# Patient Record
Sex: Male | Born: 1990 | Race: White | Hispanic: No | Marital: Married | Smoking: Never smoker
Health system: Southern US, Community
[De-identification: ages and names within clinical notes are randomized; demographics above are authoritative.]

## PROBLEM LIST (undated history)

## (undated) DIAGNOSIS — K219 Gastro-esophageal reflux disease without esophagitis: Secondary | ICD-10-CM

## (undated) HISTORY — PX: NO PAST SURGERIES: SHX2092

## (undated) HISTORY — DX: Gastro-esophageal reflux disease without esophagitis: K21.9

---

## 2021-01-24 ENCOUNTER — Other Ambulatory Visit: Payer: Self-pay

## 2021-01-24 ENCOUNTER — Encounter (HOSPITAL_BASED_OUTPATIENT_CLINIC_OR_DEPARTMENT_OTHER): Payer: Self-pay | Admitting: Obstetrics and Gynecology

## 2021-01-24 ENCOUNTER — Emergency Department (HOSPITAL_BASED_OUTPATIENT_CLINIC_OR_DEPARTMENT_OTHER): Payer: No Typology Code available for payment source | Admitting: Radiology

## 2021-01-24 ENCOUNTER — Emergency Department (HOSPITAL_BASED_OUTPATIENT_CLINIC_OR_DEPARTMENT_OTHER)
Admission: EM | Admit: 2021-01-24 | Discharge: 2021-01-25 | Disposition: A | Discharge status: Home / Self Care | Payer: No Typology Code available for payment source | Attending: Emergency Medicine | Admitting: Emergency Medicine

## 2021-01-24 DIAGNOSIS — R0789 Other chest pain: Secondary | ICD-10-CM | POA: Diagnosis not present

## 2021-01-24 DIAGNOSIS — R112 Nausea with vomiting, unspecified: Secondary | ICD-10-CM | POA: Diagnosis not present

## 2021-01-24 LAB — CBC
HCT: 46.8 % (ref 39.0–52.0)
Hemoglobin: 15.2 g/dL (ref 13.0–17.0)
MCH: 26.4 pg (ref 26.0–34.0)
MCHC: 32.5 g/dL (ref 30.0–36.0)
MCV: 81.3 fL (ref 80.0–100.0)
Platelets: 288 10*3/uL (ref 150–400)
RBC: 5.76 MIL/uL (ref 4.22–5.81)
RDW: 13 % (ref 11.5–15.5)
WBC: 12.1 10*3/uL — ABNORMAL HIGH (ref 4.0–10.5)
nRBC: 0 % (ref 0.0–0.2)

## 2021-01-24 LAB — BASIC METABOLIC PANEL
Anion gap: 11 (ref 5–15)
BUN: 16 mg/dL (ref 6–20)
CO2: 26 mmol/L (ref 22–32)
Calcium: 9.6 mg/dL (ref 8.9–10.3)
Chloride: 103 mmol/L (ref 98–111)
Creatinine, Ser: 0.86 mg/dL (ref 0.61–1.24)
GFR, Estimated: >60 mL/min (ref >60)
Glucose, Bld: 107 mg/dL — ABNORMAL HIGH (ref 70–99)
Potassium: 3.5 mmol/L (ref 3.5–5.1)
Sodium: 140 mmol/L (ref 135–145)

## 2021-01-24 LAB — TROPONIN I (HIGH SENSITIVITY): Troponin I (High Sensitivity): 3 ng/L (ref <18)

## 2021-01-24 NOTE — ED Provider Notes (Signed)
[facility] EMERGENCY DEPT Provider Note   CSN: [id number] Arrival date & time: 01/24/21  2213     History Chief Complaint  Patient presents with   Chest Pain    James Forbes is a 30 y.o. male.  Patient presents to the emergency department for evaluation of chest pain.  Patient reports that he started having chest pain around 8:30 PM.  Pain would come and go.  No radiation of the pain.  Patient reports that he acutely had an episode of vomiting and that did seem to alleviate some of the pain.  Currently he has not experiencing any chest pain.  He does not have any shortness of breath currently.  No hypertension, diabetes, hyperlipidemia, family history of heart disease.   HPI: A 30 year old patient presents for evaluation of chest pain. Initial onset of pain was approximately 3-6 hours ago. The patient's chest pain is sharp and is not worse with exertion. The patient complains of nausea. The patient's chest pain is not middle- or left-sided, is not well-localized, is not described as heaviness/pressure/tightness and does not radiate to the arms/jaw/neck. The patient denies diaphoresis. The patient has no history of stroke, has no history of peripheral artery disease, has not smoked in the past 90 days, denies any history of treated diabetes, has no relevant family history of coronary artery disease (first degree relative at less than age 31), is not hypertensive, has no history of hypercholesterolemia and does not have an elevated BMI (>=30).   History reviewed. No pertinent past medical history.  There are no problems to display for this patient.   History reviewed. No pertinent surgical history.     History reviewed. No pertinent family history.  Social History   Tobacco Use   Smoking status: Never   Smokeless tobacco: Never  Vaping Use   Vaping Use: Never used  Substance Use Topics   Alcohol use: Yes   Drug use: Yes    Frequency: 7.0 times per week     Types: Marijuana    Home Medications Prior to Admission medications   Not on File    Allergies    Patient has no allergy information on record.  Review of Systems   Review of Systems  Cardiovascular:  Positive for chest pain.  Gastrointestinal:  Positive for nausea and vomiting. Negative for abdominal pain.  All other systems reviewed and are negative.  Physical Exam Updated Vital Signs BP 107/81   Pulse 96   Temp 98.8 F (37.1 C)   Resp 13   Ht 5\' 7"  (1.702 m)   Wt 77.1 kg   SpO2 95%   BMI 26.63 kg/m   Physical Exam Vitals and nursing note reviewed.  Constitutional:      General: He is not in acute distress.    Appearance: Normal appearance. He is well-developed.  HENT:     Head: Normocephalic and atraumatic.     Right Ear: Hearing normal.     Left Ear: Hearing normal.     Nose: Nose normal.  Eyes:     Conjunctiva/sclera: Conjunctivae normal.     Pupils: Pupils are equal, round, and reactive to light.  Cardiovascular:     Rate and Rhythm: Regular rhythm.     Heart sounds: S1 normal and S2 normal. No murmur heard.   No friction rub. No gallop.  Pulmonary:     Effort: Pulmonary effort is normal. No respiratory distress.     Breath sounds: Normal breath sounds.  Chest:  Chest wall: No tenderness.  Abdominal:     General: Bowel sounds are normal.     Palpations: Abdomen is soft.     Tenderness: There is no abdominal tenderness. There is no guarding or rebound. Negative signs include Murphy's sign and McBurney's sign.     Hernia: No hernia is present.  Musculoskeletal:        General: Normal range of motion.     Cervical back: Normal range of motion and neck supple.  Skin:    General: Skin is warm and dry.     Findings: No rash.  Neurological:     Mental Status: He is alert and oriented to person, place, and time.     GCS: GCS eye subscore is 4. GCS verbal subscore is 5. GCS motor subscore is 6.     Cranial Nerves: No cranial nerve deficit.      Sensory: No sensory deficit.     Coordination: Coordination normal.  Psychiatric:        Speech: Speech normal.        Behavior: Behavior normal.        Thought Content: Thought content normal.    ED Results / Procedures / Treatments   Labs (all labs ordered are listed, but only abnormal results are displayed) Labs Reviewed  BASIC METABOLIC PANEL - Abnormal; Notable for the following components:      Result Value   Glucose, Bld 107 (*)    All other components within normal limits  CBC - Abnormal; Notable for the following components:   WBC 12.1 (*)    All other components within normal limits  D-DIMER, QUANTITATIVE  TROPONIN I (HIGH SENSITIVITY)  TROPONIN I (HIGH SENSITIVITY)    EKG EKG Interpretation  Date/Time:  Thursday January 24 2021 22:21:04 EDT Ventricular Rate:  111 PR Interval:  135 QRS Duration: 87 QT Interval:  316 QTC Calculation: 430 R Axis:   79 Text Interpretation: Sinus tachycardia Confirmed by Gilda Crease [id number]) on 01/24/2021 11:34:10 PM  Radiology DG Chest 2 View  Result Date: 01/24/2021 CLINICAL DATA:  30 year old male with chest pain. EXAM: CHEST - 2 VIEW COMPARISON:  None. FINDINGS: The heart size and mediastinal contours are within normal limits. Both lungs are clear. The visualized skeletal structures are unremarkable. IMPRESSION: No active cardiopulmonary disease. Electronically Signed   By: Elgie Collard M.D.   On: 01/24/2021 22:50    Procedures Procedures   Medications Ordered in ED Medications - No data to display  ED Course  I have reviewed the triage vital signs and the nursing notes.  Pertinent labs & imaging results that were available during my care of the patient were reviewed by me and considered in my medical decision making (see chart for details).    MDM Rules/Calculators/A&P HEAR Score: 0                        Patient presents to the emergency department for evaluation of chest pain.  Patient with history of  GERD, no other past medical history.  Symptoms are atypical, nonexertional. HEAR score is 0.  Pain is actually resolved while here in the emergency department.  EKG unremarkable.  Troponins negative x2.  Because patient was mildly tachycardic at arrival, PE was considered but felt unlikely.  D-dimer is negative, at this essentially rules out PE by Wells criteria.  Patient appears well at this time.  All other labs are unremarkable.  Chest x-ray is clear, no  evidence of lung pathology.  Patient reassured, no further work-up necessary.  Final Clinical Impression(s) / ED Diagnoses Final diagnoses:  Non-cardiac chest pain    Rx / DC Orders ED Discharge Orders     None        Yunique Dearcos, Canary Brim, MD 01/25/21 [phone]

## 2021-01-24 NOTE — ED Triage Notes (Signed)
Patient reports to the ER for chest pain and SOB. Patient reports emesis and diarrhea. Patient reports he smoked pot today.

## 2021-01-25 LAB — D-DIMER, QUANTITATIVE: D-Dimer, Quant: <0.27 ug/mL-FEU (ref 0.00–0.50)

## 2021-01-25 LAB — TROPONIN I (HIGH SENSITIVITY): Troponin I (High Sensitivity): 2 ng/L (ref <18)

## 2021-02-17 NOTE — Progress Notes (Signed)
**Note James-Identified via Obfuscation** Virtual Visit via Telephone Note  I connected with James Forbes, on 02/18/2021 at 1:56 PM by telephone due to the COVID-19 pandemic and verified that I am speaking with the correct person using two identifiers.  Due to current restrictions/limitations of in-office visits due to the COVID-19 pandemic, this scheduled clinical appointment was converted to a telehealth visit.   Consent: I discussed the limitations, risks, security and privacy concerns of performing an evaluation and management service by telephone and the availability of in person appointments. I also discussed with the patient that there may be a patient responsible charge related to this service. The patient expressed understanding and agreed to proceed.   Location of Patient: Home  Location of Provider: [facility]   Persons participating in Telemedicine visit: Merion Grimaldo Ricky Stabs, NP Margorie John, CMA   History of Present Illness: James Forbes is a 30 year-old male who presents to establish care.   Current issues and/or concerns: None   No past medical history on file. No Known Allergies  No current outpatient medications on file prior to visit.   No current facility-administered medications on file prior to visit.    Observations/Objective: Alert and oriented x 3. Not in acute distress. Physical examination not completed as this is a telemedicine visit.  Assessment and Plan: 1. Encounter to establish care: - Patient presents today to establish care.  - Return for annual physical examination, labs, and health maintenance. Arrive fasting meaning having no food for at least 8 hours prior to appointment. You may have only water or black coffee. Please take scheduled medications as normal.   Follow Up Instructions: Return for annual physical exam.    Patient was given clear instructions to go to Emergency Department or return to medical center if symptoms  don't improve, worsen, or new problems develop.The patient verbalized understanding.  I discussed the assessment and treatment plan with the patient. The patient was provided an opportunity to ask questions and all were answered. The patient agreed with the plan and demonstrated an understanding of the instructions.   The patient was advised to call back or seek an in-person evaluation if the symptoms worsen or if the condition fails to improve as anticipated.   I provided 5 minutes total of non-face-to-face time during this encounter.   Rema Fendt, NP  [facility] [place], [place] [phone] 02/18/2021, 1:56 PM

## 2021-02-18 ENCOUNTER — Other Ambulatory Visit: Payer: Self-pay

## 2021-02-18 ENCOUNTER — Encounter: Payer: Self-pay | Admitting: Family

## 2021-02-18 ENCOUNTER — Telehealth (INDEPENDENT_AMBULATORY_CARE_PROVIDER_SITE_OTHER): Payer: Self-pay | Admitting: Family

## 2021-02-18 DIAGNOSIS — Z7689 Persons encountering health services in other specified circumstances: Secondary | ICD-10-CM

## 2021-02-18 NOTE — Progress Notes (Signed)
Pt presents for telemedicine to establish care has not seen provider in 8 years

## 2021-04-08 NOTE — Progress Notes (Signed)
Patient ID: James Forbes, male    DOB: Jun 03, 1991  MRN: [id number]  CC: Annual Physical Exam   Subjective: James Forbes is a 30 y.o. male who presents for annual physical exam.   His concerns today include:  None    Current Outpatient Medications on File Prior to Visit  Medication Sig Dispense Refill   omeprazole (PRILOSEC) 20 MG capsule Take 20 mg by mouth daily.     No current facility-administered medications on file prior to visit.    No Known Allergies  Social History   Socioeconomic History   Marital status: Married    Spouse name: Not on file   Number of children: Not on file   Years of education: Not on file   Highest education level: Not on file  Occupational History   Not on file  Tobacco Use   Smoking status: Never   Smokeless tobacco: Never  Vaping Use   Vaping Use: Never used  Substance and Sexual Activity   Alcohol use: Not Currently   Drug use: Yes    Frequency: 7.0 times per week    Types: Marijuana   Sexual activity: Yes  Other Topics Concern   Not on file  Social History Narrative   Not on file   Social Determinants of Health   Financial Resource Strain: Not on file  Food Insecurity: Not on file  Transportation Needs: Not on file  Physical Activity: Not on file  Stress: Not on file  Social Connections: Not on file  Intimate Partner Violence: Not on file    No family history on file.  No past surgical history on file.  ROS: Review of Systems Negative except as stated above  PHYSICAL EXAM: BP 108/78 (BP Location: Left Arm, Patient Position: Sitting, Cuff Size: Normal)   Pulse 97   Temp 97.9 F (36.6 C)   Resp 18   Ht 5' 7.68" (1.719 m)   Wt 163 lb 9.6 oz (74.2 kg)   SpO2 98%   BMI 25.11 kg/m   Physical Exam HENT:     Head: Normocephalic and atraumatic.     Right Ear: Tympanic membrane, ear canal and external ear normal.     Left Ear: Tympanic membrane, ear canal and external ear normal.  Eyes:     Extraocular  Movements: Extraocular movements intact.     Conjunctiva/sclera: Conjunctivae normal.     Pupils: Pupils are equal, round, and reactive to light.  Cardiovascular:     Rate and Rhythm: Normal rate and regular rhythm.     Pulses: Normal pulses.     Heart sounds: Normal heart sounds.  Pulmonary:     Effort: Pulmonary effort is normal.     Breath sounds: Normal breath sounds.  Abdominal:     General: Bowel sounds are normal.     Palpations: Abdomen is soft.  Genitourinary:    Comments: Patient declined exam. Musculoskeletal:        General: Normal range of motion.     Cervical back: Normal range of motion and neck supple.  Skin:    General: Skin is warm and dry.     Capillary Refill: Capillary refill takes less than 2 seconds.  Neurological:     General: No focal deficit present.     Mental Status: He is alert and oriented to person, place, and time.  Psychiatric:        Mood and Affect: Mood normal.        Behavior: Behavior normal.  ASSESSMENT AND PLAN: 1. Annual physical exam: - Counseled on 150 minutes of exercise per week as tolerated, healthy eating (including decreased daily intake of saturated fats, cholesterol, added sugars, sodium), STI prevention, and routine healthcare maintenance.  2. Screening for metabolic disorder: - BMP last obtained 01/24/2021. - Hepatic Function Panel to screen liver function.  - Hepatic Function Panel  3. Screening for deficiency anemia: - CBC last obtained 01/24/2021. - Will recheck white blood cells as recently elevated on 01/24/2021. - WBC  4. Diabetes mellitus screening: - Hemoglobin A1c to screen for pre-diabetes/diabetes. - Hemoglobin A1c  5. Screening cholesterol level: - Lipid panel to screen for high cholesterol.  - Lipid panel  6. Thyroid disorder screen: - TSH to check thyroid function.  - TSH  7. Need for hepatitis C screening test: - Hepatitis C antibody to screen for hepatitis C.  - Hepatitis C Antibody  8. HIV  screening declined: - Patient declined.    Patient was given the opportunity to ask questions.  Patient verbalized understanding of the plan and was James to repeat key elements of the plan. Patient was given clear instructions to go to Emergency Department or return to medical center if symptoms don't improve, worsen, or new problems develop.The patient verbalized understanding.   Orders Placed This Encounter  Procedures   Hepatitis C Antibody   HIV antibody (with reflex)   Hepatic Function Panel   TSH   Hemoglobin A1c   Lipid panel    Follow-up with primary provider as scheduled.   Rema Fendt, NP

## 2021-04-09 ENCOUNTER — Other Ambulatory Visit: Payer: Self-pay

## 2021-04-10 ENCOUNTER — Ambulatory Visit (INDEPENDENT_AMBULATORY_CARE_PROVIDER_SITE_OTHER): Payer: No Typology Code available for payment source | Admitting: Family

## 2021-04-10 ENCOUNTER — Encounter: Payer: Self-pay | Admitting: Family

## 2021-04-10 VITALS — BP 108/78 | HR 97 | Temp 97.9°F | Resp 18 | Ht 67.68 in | Wt 163.6 lb

## 2021-04-10 DIAGNOSIS — Z131 Encounter for screening for diabetes mellitus: Secondary | ICD-10-CM

## 2021-04-10 DIAGNOSIS — Z5320 Procedure and treatment not carried out because of patient's decision for unspecified reasons: Secondary | ICD-10-CM | POA: Diagnosis not present

## 2021-04-10 DIAGNOSIS — Z0000 Encounter for general adult medical examination without abnormal findings: Secondary | ICD-10-CM | POA: Diagnosis not present

## 2021-04-10 DIAGNOSIS — Z130 Encounter for screening for diseases of the blood and blood-forming organs and certain disorders involving the immune mechanism: Secondary | ICD-10-CM

## 2021-04-10 DIAGNOSIS — Z13220 Encounter for screening for lipoid disorders: Secondary | ICD-10-CM

## 2021-04-10 DIAGNOSIS — Z13228 Encounter for screening for other metabolic disorders: Secondary | ICD-10-CM

## 2021-04-10 DIAGNOSIS — Z1159 Encounter for screening for other viral diseases: Secondary | ICD-10-CM

## 2021-04-10 DIAGNOSIS — Z1329 Encounter for screening for other suspected endocrine disorder: Secondary | ICD-10-CM

## 2021-04-10 NOTE — Patient Instructions (Signed)
Preventive Care 21-32 Years Old, Male Preventive care refers to lifestyle choices and visits with your health care provider that can promote health and wellness. This includes: A yearly physical exam. This is also called an annual wellness visit. Regular dental and eye exams. Immunizations. Screening for certain conditions. Healthy lifestyle choices, such as: Eating a healthy diet. Getting regular exercise. Not using drugs or products that contain nicotine and tobacco. Limiting alcohol use. What can I expect for my preventive care visit? Physical exam Your health care provider may check your: Height and weight. These may be used to calculate your BMI (body mass index). BMI is a measurement that tells if you are at a healthy weight. Heart rate and blood pressure. Body temperature. Skin for abnormal spots. Counseling Your health care provider may ask you questions about your: Past medical problems. Family's medical history. Alcohol, tobacco, and drug use. Emotional well-being. Home life and relationship well-being. Sexual activity. Diet, exercise, and sleep habits. Work and work environment. Access to firearms. What immunizations do I need? Vaccines are usually given at various ages, according to a schedule. Your health care provider will recommend vaccines for you based on your age, medical history, and lifestyle or other factors, such as travel or where you work. What tests do I need? Blood tests Lipid and cholesterol levels. These may be checked every 5 years starting at age 20. Hepatitis C test. Hepatitis B test. Screening  Diabetes screening. This is done by checking your blood sugar (glucose) after you have not eaten for a while (fasting). Genital exam to check for testicular cancer or hernias. STD (sexually transmitted disease) testing, if you are at risk. Talk with your health care provider about your test results, treatment options, and if necessary, the need for more  tests. Follow these instructions at home: Eating and drinking  Eat a healthy diet that includes fresh fruits and vegetables, whole grains, lean protein, and low-fat dairy products. Drink enough fluid to keep your urine pale yellow. Take vitamin and mineral supplements as recommended by your health care provider. Do not drink alcohol if your health care provider tells you not to drink. If you drink alcohol: Limit how much you have to 0-2 drinks a day. Be aware of how much alcohol is in your drink. In the [place] one drink equals one 12 oz bottle of beer (355 mL), one 5 oz glass of wine (148 mL), or one 1 oz glass of hard liquor (44 mL). Lifestyle Take daily care of your teeth and gums. Brush your teeth every morning and night with fluoride toothpaste. Floss one time each day. Stay active. Exercise for at least 30 minutes 5 or more days each week. Do not use any products that contain nicotine or tobacco, such as cigarettes, e-cigarettes, and chewing tobacco. If you need help quitting, ask your health care provider. Do not use drugs. If you are sexually active, practice safe sex. Use a condom or other form of protection to prevent STIs (sexually transmitted infections). Find healthy ways to cope with stress, such as: Meditation, yoga, or listening to music. Journaling. Talking to a trusted person. Spending time with friends and family. Safety Always wear your seat belt while driving or riding in a vehicle. Do not drive: If you have been drinking alcohol. Do not ride with someone who has been drinking. When you are tired or distracted. While texting. Wear a helmet and other protective equipment during sports activities. If you have firearms in your house, make sure   you follow all gun safety procedures. Seek help if you have been physically or sexually abused. What's next? Go to your health care provider once a year for an annual wellness visit. Ask your health care provider how often you  should have your eyes and teeth checked. Stay up to date on all vaccines. This information is not intended to replace advice given to you by your health care provider. Make sure you discuss any questions you have with your health care provider. Document Revised: 10/05/2020 Document Reviewed: 07/22/2018 Elsevier Patient Education  2022 Elsevier Inc.  

## 2021-04-10 NOTE — Progress Notes (Signed)
Pt presents for annual physical exam declines genital exam and STD screening/HIV,

## 2021-04-11 ENCOUNTER — Other Ambulatory Visit: Payer: Self-pay | Admitting: Family

## 2021-04-11 DIAGNOSIS — E785 Hyperlipidemia, unspecified: Secondary | ICD-10-CM

## 2021-04-11 LAB — HEPATIC FUNCTION PANEL
ALT: 21 IU/L (ref 0–44)
AST: 14 IU/L (ref 0–40)
Albumin: 5.1 g/dL — ABNORMAL HIGH (ref 4.0–5.0)
Alkaline Phosphatase: 78 IU/L (ref 44–121)
Bilirubin Total: 0.4 mg/dL (ref 0.0–1.2)
Bilirubin, Direct: <0.1 mg/dL (ref 0.00–0.40)
Total Protein: 7 g/dL (ref 6.0–8.5)

## 2021-04-11 LAB — LIPID PANEL
Chol/HDL Ratio: 6 ratio — ABNORMAL HIGH (ref 0.0–5.0)
Cholesterol, Total: 198 mg/dL (ref 100–199)
HDL: 33 mg/dL — ABNORMAL LOW (ref >39)
LDL Chol Calc (NIH): 137 mg/dL — ABNORMAL HIGH (ref 0–99)
Triglycerides: 153 mg/dL — ABNORMAL HIGH (ref 0–149)
VLDL Cholesterol Cal: 28 mg/dL (ref 5–40)

## 2021-04-11 LAB — WBC: WBC: 6.3 10*3/uL (ref 3.4–10.8)

## 2021-04-11 LAB — HEMOGLOBIN A1C
Est. average glucose Bld gHb Est-mCnc: 105 mg/dL
Hgb A1c MFr Bld: 5.3 % (ref 4.8–5.6)

## 2021-04-11 LAB — TSH: TSH: 0.735 u[IU]/mL (ref 0.450–4.500)

## 2021-04-11 LAB — HIV ANTIBODY (ROUTINE TESTING W REFLEX): HIV Screen 4th Generation wRfx: NONREACTIVE

## 2021-04-11 LAB — HEPATITIS C ANTIBODY: Hep C Virus Ab: <0.1 s/co ratio (ref 0.0–0.9)

## 2021-04-11 MED ORDER — ATORVASTATIN CALCIUM 20 MG PO TABS: 20.0000 mg | ORAL_TABLET | Freq: Every day | ORAL | 0 refills | Status: DC

## 2021-04-11 NOTE — Progress Notes (Signed)
Thyroid function normal.   Liver function normal.   No diabetes.   Hepatitis C negative.   HIV negative.   White blood cells returned to normal since 8 weeks ago.   Cholesterol higher than expected. High cholesterol may increase risk of heart attack and/or stroke. Consider eating more fruits, vegetables, and lean baked meats such as chicken or fish. Moderate intensity exercise at least 150 minutes as tolerated per week may help as well.   Begin Atorvastatin as prescribed for high cholesterol. Encouraged to recheck cholesterol at lab only visit in 6 to 8 weeks.

## 2022-04-13 IMAGING — DX DG CHEST 2V
2 series · 2 of 2 positions shown · non-contrast
Comparison: None.

CLINICAL DATA: 30-year-old male with chest pain.

EXAM:
CHEST - 2 VIEW

[chest pa]
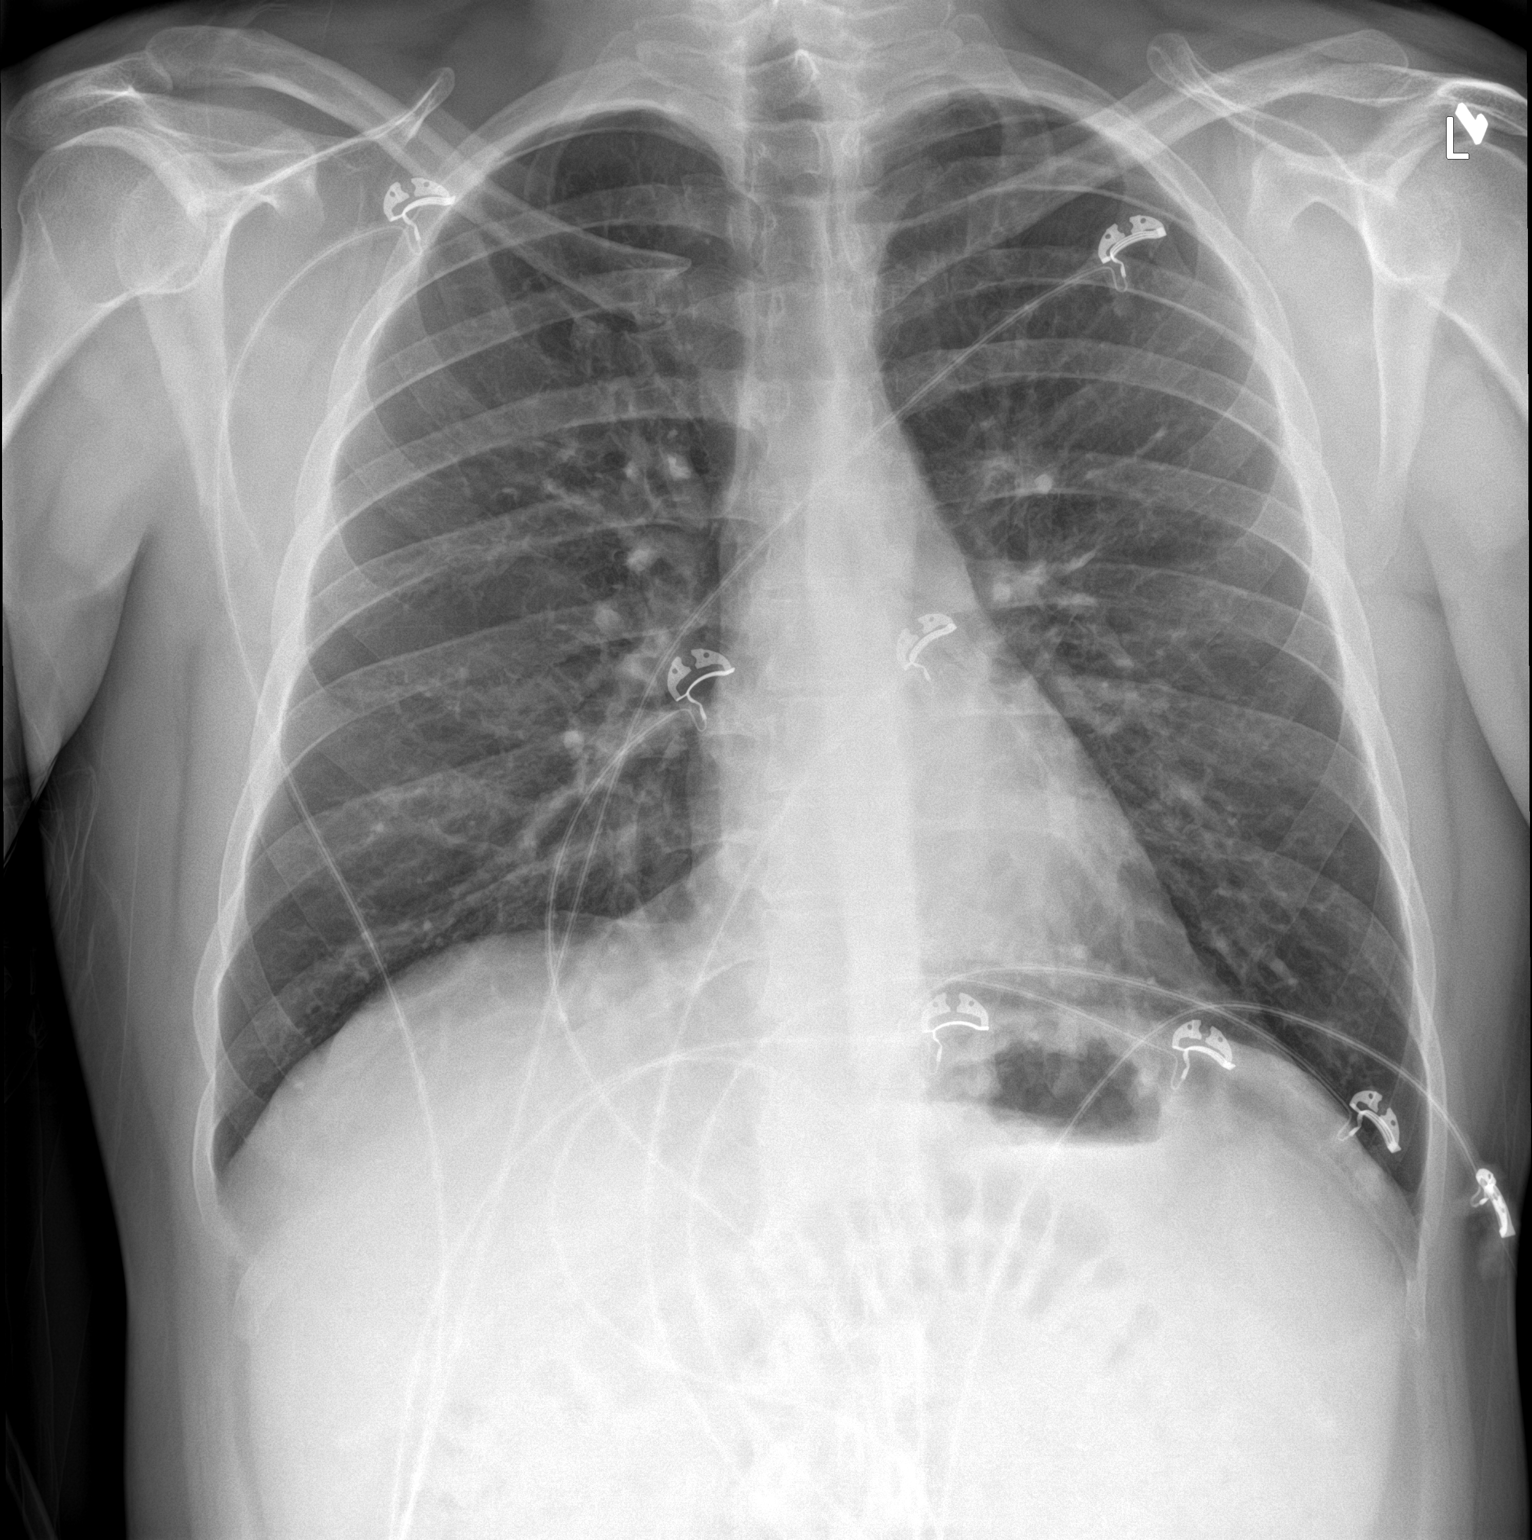

[chest lat]
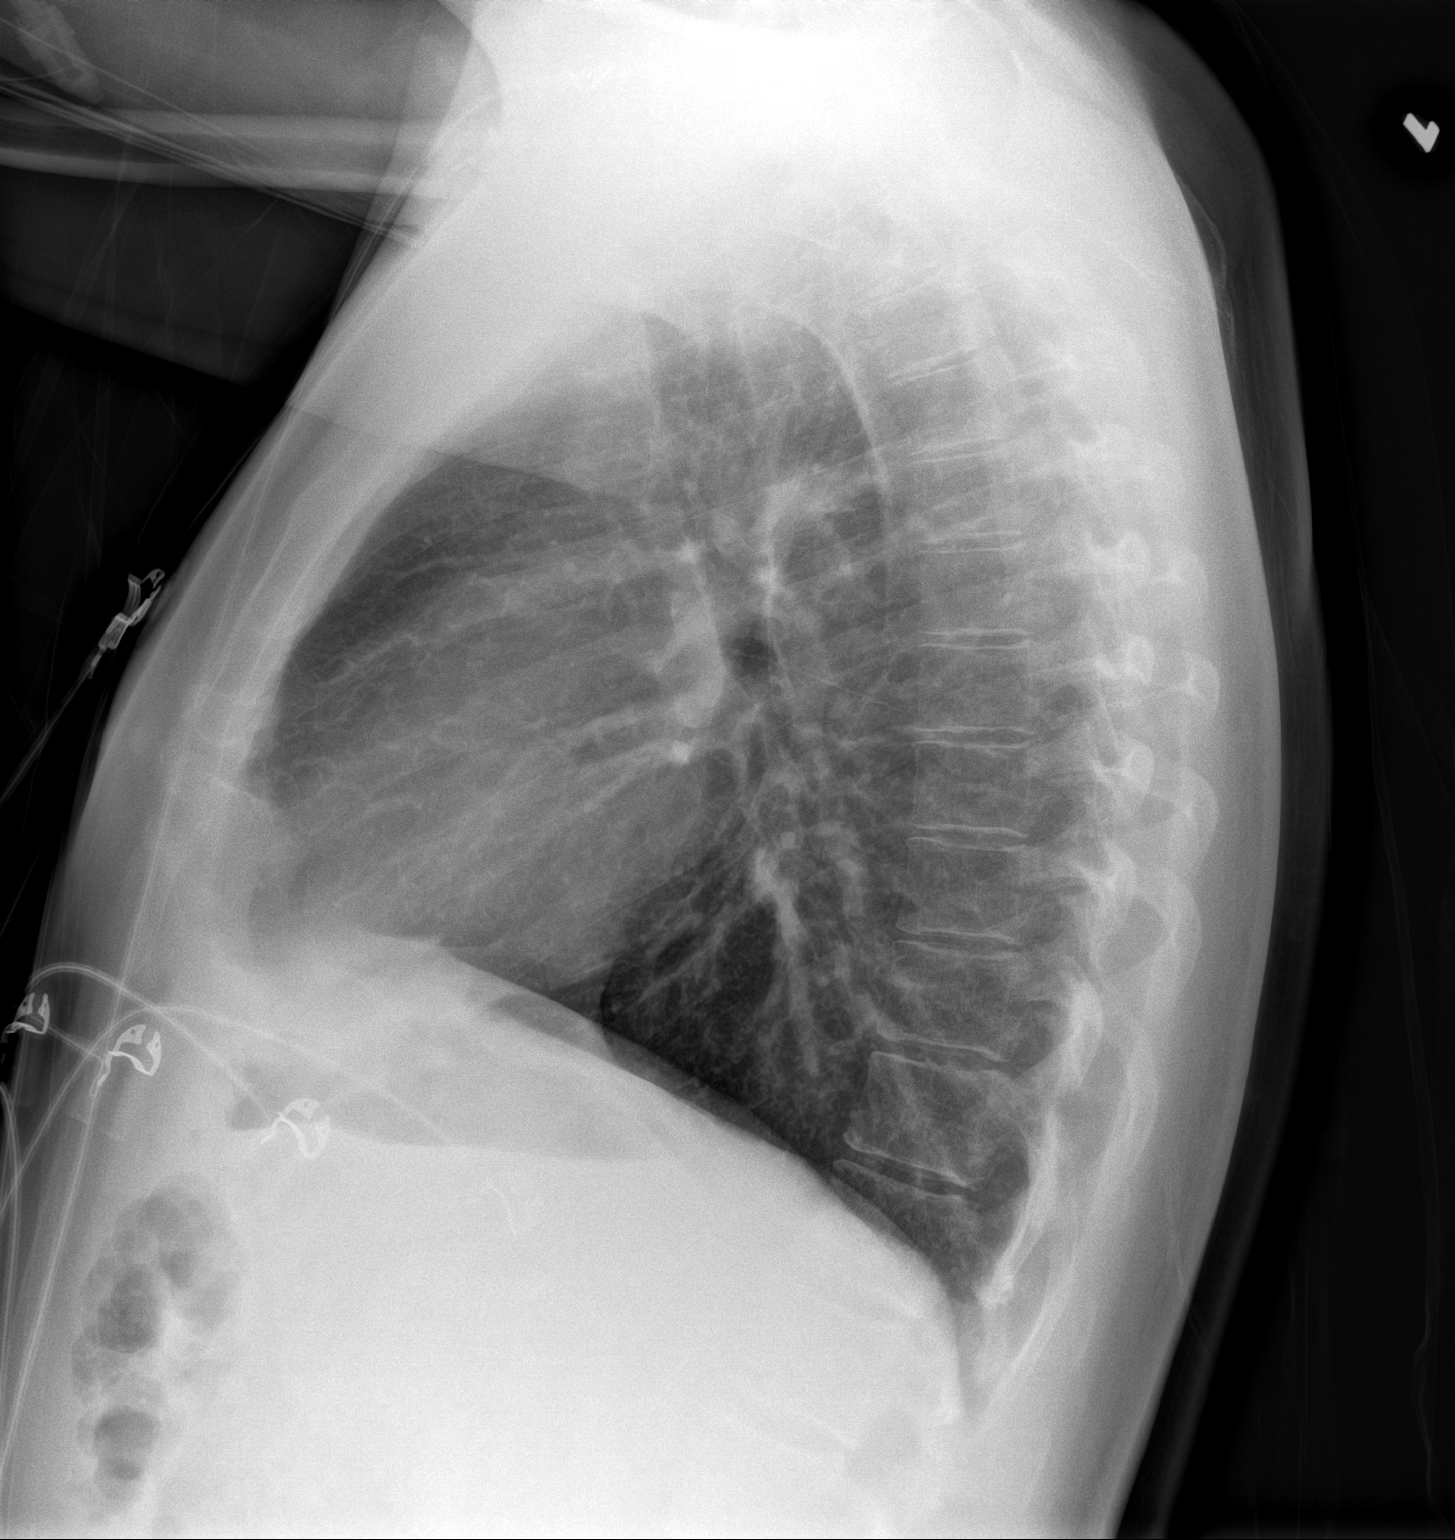

[2 of 2 positions shown; findings below may reference images not displayed]

FINDINGS: The heart size and mediastinal contours are within normal limits.
Both lungs are clear. The visualized skeletal structures are
unremarkable.
IMPRESSION: No active cardiopulmonary disease.

## 2023-05-14 ENCOUNTER — Ambulatory Visit (INDEPENDENT_AMBULATORY_CARE_PROVIDER_SITE_OTHER): Payer: No Typology Code available for payment source | Admitting: Family

## 2023-05-14 ENCOUNTER — Encounter: Payer: Self-pay | Admitting: Family

## 2023-05-14 VITALS — BP 128/88 | HR 104 | Temp 98.0°F | Ht 67.0 in | Wt 214.0 lb

## 2023-05-14 DIAGNOSIS — Z79899 Other long term (current) drug therapy: Secondary | ICD-10-CM

## 2023-05-14 DIAGNOSIS — K219 Gastro-esophageal reflux disease without esophagitis: Secondary | ICD-10-CM

## 2023-05-14 DIAGNOSIS — L659 Nonscarring hair loss, unspecified: Secondary | ICD-10-CM | POA: Diagnosis not present

## 2023-05-14 DIAGNOSIS — E7800 Pure hypercholesterolemia, unspecified: Secondary | ICD-10-CM | POA: Diagnosis not present

## 2023-05-14 DIAGNOSIS — R635 Abnormal weight gain: Secondary | ICD-10-CM

## 2023-05-14 DIAGNOSIS — Z3009 Encounter for other general counseling and advice on contraception: Secondary | ICD-10-CM | POA: Insufficient documentation

## 2023-05-14 DIAGNOSIS — R5383 Other fatigue: Secondary | ICD-10-CM | POA: Diagnosis not present

## 2023-05-14 DIAGNOSIS — H5789 Other specified disorders of eye and adnexa: Secondary | ICD-10-CM | POA: Insufficient documentation

## 2023-05-14 LAB — CBC WITH DIFFERENTIAL/PLATELET
Basophils Absolute: 0.1 10*3/uL (ref 0.0–0.1)
Basophils Relative: 0.8 % (ref 0.0–3.0)
Eosinophils Absolute: 0.2 10*3/uL (ref 0.0–0.7)
Eosinophils Relative: 3.6 % (ref 0.0–5.0)
HCT: 48.8 % (ref 39.0–52.0)
Hemoglobin: 15.6 g/dL (ref 13.0–17.0)
Lymphocytes Relative: 14.2 % (ref 12.0–46.0)
Lymphs Abs: 0.9 10*3/uL (ref 0.7–4.0)
MCHC: 32 g/dL (ref 30.0–36.0)
MCV: 81.1 fL (ref 78.0–100.0)
Monocytes Absolute: 0.6 10*3/uL (ref 0.1–1.0)
Monocytes Relative: 9.2 % (ref 3.0–12.0)
Neutro Abs: 4.7 10*3/uL (ref 1.4–7.7)
Neutrophils Relative %: 72.2 % (ref 43.0–77.0)
Platelets: 267 10*3/uL (ref 150.0–400.0)
RBC: 6.02 Mil/uL — ABNORMAL HIGH (ref 4.22–5.81)
RDW: 14 % (ref 11.5–15.5)
WBC: 6.4 10*3/uL (ref 4.0–10.5)

## 2023-05-14 LAB — COMPREHENSIVE METABOLIC PANEL
ALT: 39 U/L (ref 0–53)
AST: 24 U/L (ref 0–37)
Albumin: 4.4 g/dL (ref 3.5–5.2)
Alkaline Phosphatase: 65 U/L (ref 39–117)
BUN: 11 mg/dL (ref 6–23)
CO2: 31 meq/L (ref 19–32)
Calcium: 9.5 mg/dL (ref 8.4–10.5)
Chloride: 104 meq/L (ref 96–112)
Creatinine, Ser: 0.87 mg/dL (ref 0.40–1.50)
GFR: 112.41 mL/min (ref >60.00)
Glucose, Bld: 99 mg/dL (ref 70–99)
Potassium: 4.2 meq/L (ref 3.5–5.1)
Sodium: 142 meq/L (ref 135–145)
Total Bilirubin: 0.4 mg/dL (ref 0.2–1.2)
Total Protein: 6.7 g/dL (ref 6.0–8.3)

## 2023-05-14 LAB — LIPID PANEL
Cholesterol: 196 mg/dL (ref 0–200)
HDL: 37.4 mg/dL — ABNORMAL LOW (ref >39.00)
LDL Cholesterol: 129 mg/dL — ABNORMAL HIGH (ref 0–99)
NonHDL: 158.25
Total CHOL/HDL Ratio: 5
Triglycerides: 147 mg/dL (ref 0.0–149.0)
VLDL: 29.4 mg/dL (ref 0.0–40.0)

## 2023-05-14 LAB — TSH: TSH: 0.57 u[IU]/mL (ref 0.35–5.50)

## 2023-05-14 LAB — VITAMIN B12: Vitamin B-12: 362 pg/mL (ref 211–911)

## 2023-05-14 LAB — T4, FREE: Free T4: 0.85 ng/dL (ref 0.60–1.60)

## 2023-05-14 LAB — T3, FREE: T3, Free: 3.8 pg/mL (ref 2.3–4.2)

## 2023-05-14 NOTE — Progress Notes (Signed)
New Patient Office Visit  Subjective:  Patient ID: James Forbes, male    DOB: [birth year]  Age: 32 y.o. MRN: [id number]  CC:  Chief Complaint  Patient presents with   Establish Care    HPI James Forbes is here to establish care as a new patient.  Oriented to practice routines and expectations.  Prior provider was: Ricky Stabs, NP   Pt is with acute concerns.  With athletes foot, has tried otc steroid and also antifungal without much relief.   Bil eyes will get red and painful at times. Wasn't sure if high blood pressure, do not itch and or feel overly dry. States get blood shot and can get painful to move or focus. Does often work on a computer, but doesn't know if this contributes to this or not.   chronic concerns:  GERD: omeprazole, drinks a lot of caffeine has tried to wean in the past but automatically comes back with rebound gerd. He has been taking this since age 24. He admits to 500 mg caffeine daily.   Hair loss, thinning more than usual. Father is bald. He does wonder if he has a vitamin def. He does report an abn amount of weight despite his calorie intake. Denies joint pains does not feel cold often. Often with fatigue, mostly in the afternoon.   Wt Readings from Last 3 Encounters:  05/14/23 214 lb (97.1 kg)  04/10/21 163 lb 9.6 oz (74.2 kg)  01/24/21 170 lb (77.1 kg)        ROS: Negative unless specifically indicated above in HPI.   Current Outpatient Medications:    omeprazole (PRILOSEC) 20 MG capsule, Take 20 mg by mouth daily., Disp: , Rfl:  Past Medical History:  Diagnosis Date   GERD (gastroesophageal reflux disease)    Past Surgical History:  Procedure Laterality Date   NO PAST SURGERIES      Objective:   Today's Vitals: BP 128/88   Pulse (!) 104   Temp 98 F (36.7 C) (Temporal)   Ht 5\' 7"  (1.702 m)   Wt 214 lb (97.1 kg)   SpO2 96%   BMI 33.52 kg/m   Physical Exam Constitutional:      General: He is not in acute distress.     Appearance: Normal appearance. He is obese. He is not ill-appearing, toxic-appearing or diaphoretic.  Cardiovascular:     Rate and Rhythm: Normal rate and regular rhythm.  Pulmonary:     Effort: Pulmonary effort is normal.     Breath sounds: Normal breath sounds.  Musculoskeletal:        General: Normal range of motion.  Feet:     Comments: White excoriation between webbing bil toes. Fungal infection toes left great toe and 2nd and third metatarsal  Neurological:     General: No focal deficit present.     Mental Status: He is alert and oriented to person, place, and time. Mental status is at baseline.  Psychiatric:        Mood and Affect: Mood normal.        Behavior: Behavior normal.        Thought Content: Thought content normal.        Judgment: Judgment normal.     Assessment & Plan:  Gastroesophageal reflux disease without esophagitis Assessment & Plan: Long discussion with pt on long term PPI status Advised to work on gerd problem areas and trial d/c  If no improvement may have to consider GI referral for possible  EGD H pylori ordered pending results.   Orders: -     Ambulatory referral to Gastroenterology -     H. pylori breath test  Abnormal weight gain Assessment & Plan: Thyroid workup  Ordered labs and pending  Orders: -     Thyroid Peroxidase Antibodies (TPO) (REFL) -     T3, free -     T4, free -     TSH -     Comprehensive metabolic panel  Other fatigue -     Thyroid Peroxidase Antibodies (TPO) (REFL) -     T3, free -     T4, free -     TSH -     CBC with Differential/Platelet -     Vitamin B12 -     Comprehensive metabolic panel  Hair loss -     CBC with Differential/Platelet  Long-term current use of proton pump inhibitor therapy  Elevated LDL cholesterol level Assessment & Plan: Ordered lipid panel, pending results. Work on low cholesterol diet and exercise as tolerated   Orders: -     Lipid panel  Vasectomy evaluation -      Ambulatory referral to Urology  Eye redness    Follow-up: Return in about 1 year (around 05/13/2024) for f/u CPE.   Mort Sawyers, FNP

## 2023-05-14 NOTE — Assessment & Plan Note (Signed)
Long discussion with pt on long term PPI status Advised to work on gerd problem areas and trial d/c  If no improvement may have to consider GI referral for possible EGD H pylori ordered pending results.

## 2023-05-14 NOTE — Patient Instructions (Addendum)
  A referral was placed today for GI for your heartburn. Please let us know if you have not heard back within 2 weeks about the referral.  A referral was placed today for urology. Please let us know if you have not heard back within 2 weeks about the referral.

## 2023-05-14 NOTE — Assessment & Plan Note (Signed)
Ordered lipid panel, pending results. Work on low cholesterol diet and exercise as tolerated  

## 2023-05-14 NOTE — Assessment & Plan Note (Signed)
Thyroid workup  Ordered labs and pending

## 2023-05-15 ENCOUNTER — Encounter: Payer: Self-pay | Admitting: Family

## 2023-05-15 ENCOUNTER — Other Ambulatory Visit: Payer: Self-pay | Admitting: Family

## 2023-05-15 DIAGNOSIS — Z5181 Encounter for therapeutic drug level monitoring: Secondary | ICD-10-CM

## 2023-05-15 DIAGNOSIS — B351 Tinea unguium: Secondary | ICD-10-CM

## 2023-05-15 DIAGNOSIS — B353 Tinea pedis: Secondary | ICD-10-CM

## 2023-05-15 MED ORDER — TERBINAFINE HCL 250 MG PO TABS: 250.0000 mg | ORAL_TABLET | Freq: Every day | ORAL | 0 refills | Status: DC

## 2023-05-18 LAB — H. PYLORI BREATH TEST: H. pylori Breath Test: NOT DETECTED

## 2023-05-18 LAB — THYROID PEROXIDASE ANTIBODIES (TPO) (REFL): Thyroperoxidase Ab SerPl-aCnc: <1 [IU]/mL (ref <9)

## 2023-05-21 ENCOUNTER — Encounter: Payer: Self-pay | Admitting: Family

## 2023-05-21 ENCOUNTER — Other Ambulatory Visit: Payer: Self-pay | Admitting: Family

## 2023-05-21 DIAGNOSIS — B353 Tinea pedis: Secondary | ICD-10-CM

## 2023-05-21 DIAGNOSIS — B351 Tinea unguium: Secondary | ICD-10-CM

## 2023-05-26 MED ORDER — TERBINAFINE HCL 250 MG PO TABS: 250.0000 mg | ORAL_TABLET | Freq: Every day | ORAL | 0 refills | Status: AC

## 2023-06-03 ENCOUNTER — Encounter: Payer: Self-pay | Admitting: Family

## 2023-06-17 ENCOUNTER — Ambulatory Visit: Payer: No Typology Code available for payment source | Admitting: Urology

## 2023-06-17 VITALS — BP 126/81 | HR 87 | Ht 67.0 in | Wt 214.1 lb

## 2023-06-17 DIAGNOSIS — Z3009 Encounter for other general counseling and advice on contraception: Secondary | ICD-10-CM | POA: Diagnosis not present

## 2023-06-17 MED ORDER — DIAZEPAM 10 MG PO TABS: 10.0000 mg | ORAL_TABLET | Freq: Once | ORAL | 0 refills | Status: AC

## 2023-06-17 NOTE — Progress Notes (Signed)
**Note James-Identified via Obfuscation** Marcelle Overlie Plume,acting as a scribe for Vanna Scotland, MD.,have documented all relevant documentation on the behalf of Vanna Scotland, MD,as directed by  Vanna Scotland, MD while in the presence of Vanna Scotland, MD.  06/17/2023 9:17 AM   James Forbes [birth year] [id number]  Referring provider: Mort Sawyers, FNP [address] [place],  [place] [address]  Chief Complaint  Patient presents with   Establish Care   VAS Consult    HPI: 32 y.o. year old male referred for further evaluation of possible vasectomy.  He denies a history of testicular trauma or pain.  No urinary issues.  No previous scrotal surgeries.  He has 3 children with one on the way. His birth control failed for the 4th one. He does not desire any further biological pregnancies.    PMH: Past Medical History:  Diagnosis Date   GERD (gastroesophageal reflux disease)     Surgical History: Past Surgical History:  Procedure Laterality Date   NO PAST SURGERIES      Home Medications:  Allergies as of 06/17/2023   No Known Allergies      Medication List        Accurate as of June 17, 2023  9:17 AM. If you have any questions, ask your nurse or doctor.          omeprazole 20 MG capsule Commonly known as: PRILOSEC Take 20 mg by mouth daily.   terbinafine 250 MG tablet Commonly known as: LAMISIL Take 1 tablet (250 mg total) by mouth daily.        Family History: Family History  Problem Relation Age of Onset   Alcoholism Mother    Dementia Father        early onset   Alcoholism Father    Stomach cancer Maternal Grandmother    Healthy Paternal Grandmother    Dementia Paternal Grandfather     Social History:  reports that he has never smoked. He has never used smokeless tobacco. He reports that he does not currently use alcohol. He reports that he does not currently use drugs after having used the following drugs: Marijuana. Frequency: 7.00 times per week.   Physical  Exam: BP 126/81   Pulse 87   Ht 5\' 7"  (1.702 m)   Wt 214 lb 2 oz (97.1 kg)   BMI 33.54 kg/m   Constitutional:  Alert and oriented, No acute distress. HEENT: [place] AT, moist mucus membranes.  Trachea midline, no masses. Cardiovascular: No clubbing, cyanosis, or edema. Respiratory: Normal respiratory effort, no increased work of breathing. GI: Abdomen is soft, nontender, nondistended, no abdominal masses GU: Normal phallus.  Bilateral descended testicles without masses.  Vasa easily palpable bilaterally. Skin: No rashes, bruises or suspicious lesions. Neurologic: Grossly intact, no focal deficits, moving all 4 extremities. Psychiatric: Normal mood and affect.   Assessment & Plan:    1. Vasectomy evaluation Today, we discussed what the vas deferens is, where it is located, and its function. We reviewed the procedure for vasectomy, it's risks, benefits, alternatives, and likelihood of achieving his goals. We discussed in detail the procedure, complications, and recovery as well as the need for clearance prior to unprotected intercourse. We discussed that vasectomy does not protect against sexually transmitted diseases. We discussed that this procedure does not result in immediate sterility and that they would need to use other forms of birth control until he has been cleared with negative postvasectomy semen analyses. I explained that the procedure is considered to be  permanent and that attempts at reversal have varying degrees of success. These options include vasectomy reversal, sperm retrieval, and in vitro fertilization; these can be very expensive. We discussed the chance of postvasectomy pain syndrome which occurs in less than 5% of patients. I explained to the patient that there is no treatment to resolve this chronic pain, and that if it developed I would not be able to help resolve the issue, but that surgery is generally not needed for correction. I explained there have even been reports of  systemic like illness associated with this chronic pain, and that there was no good cure. I explained that vasectomy it is not a 100% reliable form of birth control, and the risk of pregnancy after vasectomy is approximately 1 in 2000 men who had a negative postvasectomy semen analysis or rare non-motile sperm. I explained that repeat vasectomy was necessary in less than 1% of vasectomy procedures when employing the type of technique that I use. I explained that he should refrain from ejaculation for approximately one week following vasectomy. I explained that there are other options for birth control which are permanent and non-permanent; we discussed these. I explained the rates of surgical complications, such as symptomatic hematoma or infection, are low (1-2%) and vary with the surgeon's experience and criteria used to diagnose the complication.   The patient had the opportunity to ask questions to his stated satisfaction. He voiced understanding of the above factors and stated that he has read all the information provided to him and the packets and informed consent.  He is interested in receiving of Valium 10 mg prior to the procedure for the purpose of anxiolysis.  A prescription was given today.  He will have a driver on the day of the procedure.  He has a new job and wants to wait about 3 months before scheduling this due to a concern for missing work. He will call us closer to that time for his prescription for Valium.   Return for vasectomy.   [facility] [address], [address] [place], [place] [address] [phone]

## 2023-06-17 NOTE — Patient Instructions (Signed)
Pre-Vasectomy Instructions ? ?STOP all aspirin or blood thinners (Aspirin, Plavix, Coumadin, Warfarin, Motrin, Ibuprofen, Advil, Aleve, Naproxen, Naprosyn) for 7 days prior to the procedure.  If you have any questions about stopping these medications please contact your primary care physician or cardiologist. ? ?Shave all hair from the upper scrotum on the day of the procedure.  This means just under the penis onto the scrotal sac.  The area shaved should measure about 2-3 inches around.  You may lather the scrotum with soap and water, and shave with a safety razor. ? ?After shaving the area, thoroughly wash the penis and the scrotum, then shower or bathe to remove all the loose hairs.  If needed, wash the area again just before coming in for your Vasectomy. ? ?It is recommended to have a light meal an hour or so prior to the procedure. ? ?Bring a scrotal support (jock strap or suspensory, or tight jockey shorts or underwear).  Wear comfortable pants or shorts. ? ?While the actual procedure usually takes about 45 minutes, you should be prepared to stay in the office for approximately one hour.  Bring someone with you to drive you home. ? ?If you have any questions or concerns, please feel free to call the office at [phone]. ?  ?

## 2023-07-02 ENCOUNTER — Ambulatory Visit: Payer: No Typology Code available for payment source | Admitting: Gastroenterology

## 2023-07-02 NOTE — Progress Notes (Deleted)
    Gastroenterology Consultation  Referring Provider:     Mort Sawyers, FNP Primary Care Physician:  Mort Sawyers, FNP Primary Gastroenterologist:  Dr. Servando Snare     Reason for Consultation:     GERD        HPI:   James Forbes is a 32 y.o. y/o male referred for consultation & management of GERD by Dr. Mort Sawyers, FNP.  This patient come in with a report of GERD. The patient has been treated with Omeprazole since 32 years old. He has a lot of caffeine use and has tried to come off the PPI with return of symptoms.   Past Medical History:  Diagnosis Date   GERD (gastroesophageal reflux disease)     Past Surgical History:  Procedure Laterality Date   NO PAST SURGERIES      Prior to Admission medications   Medication Sig Start Date End Date Taking? Authorizing Provider  omeprazole (PRILOSEC) 20 MG capsule Take 20 mg by mouth daily.    [provider]  terbinafine (LAMISIL) 250 MG tablet Take 1 tablet (250 mg total) by mouth daily. 05/26/23 08/24/23  Mort Sawyers, FNP    Family History  Problem Relation Age of Onset   Alcoholism Mother    Dementia Father        early onset   Alcoholism Father    Stomach cancer Maternal Grandmother    Healthy Paternal Grandmother    Dementia Paternal Grandfather      Social History   Tobacco Use   Smoking status: Never   Smokeless tobacco: Never  Vaping Use   Vaping status: Never Used  Substance Use Topics   Alcohol use: Not Currently   Drug use: Not Currently    Frequency: 7.0 times per week    Types: Marijuana    Allergies as of 07/02/2023   (No Known Allergies)    Review of Systems:    All systems reviewed and negative except where noted in HPI.   Physical Exam:  There were no vitals taken for this visit. No LMP for male patient. General:   Alert,  Well-developed, well-nourished, pleasant and cooperative in NAD Head:  Normocephalic and atraumatic. Eyes:  Sclera clear, no icterus.   Conjunctiva pink. Ears:   Normal auditory acuity. Neck:  Supple; no masses or thyromegaly. Lungs:  Respirations even and unlabored.  Clear throughout to auscultation.   No wheezes, crackles, or rhonchi. No acute distress. Heart:  Regular rate and rhythm; no murmurs, clicks, rubs, or gallops. Abdomen:  Normal bowel sounds.  No bruits.  Soft, non-tender and non-distended without masses, hepatosplenomegaly or hernias noted.  No guarding or rebound tenderness.  Negative Carnett sign.   Rectal:  Deferred.  Pulses:  Normal pulses noted. Extremities:  No clubbing or edema.  No cyanosis. Neurologic:  Alert and oriented x3;  grossly normal neurologically. Skin:  Intact without significant lesions or rashes.  No jaundice. Lymph Nodes:  No significant cervical adenopathy. Psych:  Alert and cooperative. Normal mood and affect.  Imaging Studies: No results found.  Assessment and Plan:   James Forbes is a 32 y.o. y/o male ***    Midge Minium, MD. Clementeen Graham    Note: This dictation was prepared with Dragon dictation along with smaller phrase technology. Any transcriptional errors that result from this process are unintentional.

## 2023-08-19 ENCOUNTER — Encounter: Payer: Self-pay | Admitting: Family Medicine

## 2023-08-19 ENCOUNTER — Ambulatory Visit: Payer: No Typology Code available for payment source | Admitting: Family Medicine

## 2023-08-19 VITALS — BP 110/70 | HR 119 | Temp 98.6°F | Ht 67.0 in | Wt 208.1 lb

## 2023-08-19 DIAGNOSIS — R051 Acute cough: Secondary | ICD-10-CM

## 2023-08-19 DIAGNOSIS — J208 Acute bronchitis due to other specified organisms: Secondary | ICD-10-CM | POA: Diagnosis not present

## 2023-08-19 LAB — POC INFLUENZA A&B (BINAX/QUICKVUE)
Influenza A, POC: NEGATIVE
Influenza B, POC: NEGATIVE

## 2023-08-19 LAB — POC COVID19 BINAXNOW: SARS Coronavirus 2 Ag: NEGATIVE

## 2023-08-19 MED ORDER — DOXYCYCLINE HYCLATE 100 MG PO TABS: 100.0000 mg | ORAL_TABLET | Freq: Two times a day (BID) | ORAL | 0 refills | Status: DC

## 2023-08-19 NOTE — Progress Notes (Signed)
 Florie Carico T. Kimyatta Lecy, MD, CAQ Sports Medicine [facility] [address] [place], [address]  Phone: [phone]  FAX: [phone]  James Forbes - 33 y.o. male  MRN [id number]  Date of Birth: [birth year]  Date: 08/19/2023  PCP: Corwin Antu, FNP  Referral: Corwin Antu, FNP  Chief Complaint  Patient presents with   Cough    Stared New Year's Eve-Cough won't go away Children have Pneumonia/Ear Infections   Subjective:   James Forbes is a 33 y.o. very pleasant male patient who presents with the following:  Patient presents for presents evaluation of myalgias, nasal congestion, productive cough, rhinorrhea , sneezing and sore throat. Symptoms began > 1 week ago and are gradually worsening since that time.    Risk Factors:none.  Close contacts are sick.  The patient denies significant nausea, vomitting, diarrhea, rash, diffuse arthralgia or myalgia. They also deny high fever.    Review of Systems is noted in the HPI, as appropriate  Objective:   BP 110/70 (BP Location: Left Arm, Patient Position: Sitting, Cuff Size: Large)   Pulse (!) 119   Temp 98.6 F (37 C) (Temporal)   Ht 5' 7 (1.702 m)   Wt 208 lb 2 oz (94.4 kg)   SpO2 98%   BMI 32.60 kg/m    GEN: WDWN. NAD.    ENT: Nose clear, ext NML.  No LAD.  No JVD.  TM's clear. Oropharynx clear.  PULM: Normal WOB, no distress. No crackles, wheezes. B base of lungs rhonchi. CV: RRR, no M/G/R, No rubs, No JVD.   EXT: warm and well-perfused, No c/c/e. PSYCH: Pleasant and conversant.   Laboratory and Imaging Data: Results for orders placed or performed in visit on 08/19/23  POC COVID-19   Collection Time: 08/19/23 12:22 PM  Result Value Ref Range   SARS Coronavirus 2 Ag Negative Negative  POC Influenza A&B (Binax test)   Collection Time: 08/19/23 12:23 PM  Result Value Ref Range   Influenza A, POC Negative Negative   Influenza B, POC Negative Negative     Assessment and  Plan:     ICD-10-CM   1. Acute bronchitis due to other specified organisms  J20.8     2. Acute cough  R05.1 POC COVID-19    POC Influenza A&B (Binax test)     At this point, we reviewed supportive care. Given the length of time and risk factors, treat with medications below.  With bilateral abnormal lung sounds, I am going to place him on antibiotics.  Pneumonia cannot be excluded.  Medical decision making includes all plans, orders, medications, and patient instructions reviewed face to face.   Follow-up: No follow-ups on file.  Meds ordered this encounter  Medications   doxycycline  (VIBRA -TABS) 100 MG tablet    Sig: Take 1 tablet (100 mg total) by mouth 2 (two) times daily.    Dispense:  20 tablet    Refill:  0   There are no discontinued medications. Orders Placed This Encounter  Procedures   POC COVID-19   POC Influenza A&B (Binax test)    Signed,  Quentin Strebel T. Kalum Minner, MD   Outpatient Encounter Medications as of 08/19/2023  Medication Sig   doxycycline  (VIBRA -TABS) 100 MG tablet Take 1 tablet (100 mg total) by mouth 2 (two) times daily.   omeprazole (PRILOSEC) 20 MG capsule Take 20 mg by mouth daily.   terbinafine  (LAMISIL ) 250 MG tablet Take 1 tablet (250 mg total) by mouth daily.  No facility-administered encounter medications on file as of 08/19/2023.

## 2023-09-04 ENCOUNTER — Encounter: Payer: Self-pay | Admitting: Family

## 2023-09-04 ENCOUNTER — Ambulatory Visit (INDEPENDENT_AMBULATORY_CARE_PROVIDER_SITE_OTHER): Payer: No Typology Code available for payment source | Admitting: Family

## 2023-09-04 VITALS — BP 126/80 | HR 92 | Temp 98.0°F | Ht 68.0 in | Wt 212.0 lb

## 2023-09-04 DIAGNOSIS — H5789 Other specified disorders of eye and adnexa: Secondary | ICD-10-CM

## 2023-09-04 DIAGNOSIS — H6691 Otitis media, unspecified, right ear: Secondary | ICD-10-CM | POA: Diagnosis not present

## 2023-09-04 MED ORDER — AMOXICILLIN-POT CLAVULANATE 875-125 MG PO TABS: 1.0000 | ORAL_TABLET | Freq: Two times a day (BID) | ORAL | 0 refills | Status: DC

## 2023-09-04 NOTE — Assessment & Plan Note (Signed)
Suspect from coughing so much however pt states happens every 5 weeks or so  Suggested allergy medication such as zyrtec and flonase.  Also make appt with optometry to evaluate further.

## 2023-09-04 NOTE — Patient Instructions (Signed)
------------------------------------  Recommend daily flonase and also zyrtec at night for allergies.   ------------------------------------

## 2023-09-04 NOTE — Progress Notes (Signed)
Established Patient Office Visit  Subjective:   Patient ID: James Forbes, male    DOB: [birth year]  Age: 33 y.o. MRN: [id number]  CC:  Chief Complaint  Patient presents with   Acute Visit    Reports R ear pain and cough. Saw Dr. Patsy Lager earlier in the month for the cough.    HPI: James Forbes is a 33 y.o. male presenting on 09/04/2023 for Acute Visit (Reports R ear pain and cough. Saw Dr. Patsy Lager earlier in the month for the cough.)  Three days ago started with right ear pain. He started with a bad headache which then progressed to rhinorrhea and a cough that is nonproductive. No chest congestion. No sore throat, some nasal congestion.   Noticed a red eye started this am only. No crusting. No change in vision.   He did see Dr. Patsy Lager in office 1/8 for over one week  Dx with sinusitis and prescribed doxycycline 1 tab bid x 10 days.  Had been tested for covid and flu and was negative. He does state symptoms had improved since the last sickness.       ROS: Negative unless specifically indicated above in HPI.   Relevant past medical history reviewed and updated as indicated.   Allergies and medications reviewed and updated.   Current Outpatient Medications:    amoxicillin-clavulanate (AUGMENTIN) 875-125 MG tablet, Take 1 tablet by mouth 2 (two) times daily., Disp: 20 tablet, Rfl: 0   omeprazole (PRILOSEC) 20 MG capsule, Take 20 mg by mouth daily., Disp: , Rfl:   No Known Allergies  Objective:   BP 126/80 (BP Location: Left Arm, Patient Position: Sitting, Cuff Size: Normal)   Pulse 92   Temp 98 F (36.7 C) (Temporal)   Ht 5\' 8"  (1.727 m)   Wt 212 lb (96.2 kg)   SpO2 97%   BMI 32.23 kg/m    Physical Exam Vitals reviewed.  Constitutional:      General: He is not in acute distress.    Appearance: Normal appearance. He is obese. He is not ill-appearing, toxic-appearing or diaphoretic.  HENT:     Head: Normocephalic.     Right Ear: Tympanic membrane is  erythematous and retracted.     Left Ear: Tympanic membrane normal.     Nose: Nose normal.     Mouth/Throat:     Mouth: Mucous membranes are moist.  Eyes:     Pupils: Pupils are equal, round, and reactive to light.  Cardiovascular:     Rate and Rhythm: Normal rate and regular rhythm.  Pulmonary:     Effort: Pulmonary effort is normal.     Breath sounds: Examination of the right-upper field reveals rhonchi. Rhonchi present. No decreased breath sounds or wheezing.     Comments: Right upper lobe expiratory rhonchi very slight  Musculoskeletal:        General: Normal range of motion.     Cervical back: Normal range of motion.  Neurological:     General: No focal deficit present.     Mental Status: He is alert and oriented to person, place, and time. Mental status is at baseline.  Psychiatric:        Mood and Affect: Mood normal.        Behavior: Behavior normal.        Thought Content: Thought content normal.        Judgment: Judgment normal.     Assessment & Plan:  Right acute otitis media -  Amoxicillin-Pot Clavulanate; Take 1 tablet by mouth 2 (two) times daily.  Dispense: 20 tablet; Refill: 0  Eye redness Assessment & Plan: Suspect from coughing so much however pt states happens every 5 weeks or so  Suggested allergy medication such as zyrtec and flonase.  Also make appt with optometry to evaluate further.       Follow up plan: Return if symptoms worsen or fail to improve.  Mort Sawyers, FNP

## 2024-05-13 ENCOUNTER — Ambulatory Visit: Payer: No Typology Code available for payment source | Admitting: Family

## 2024-05-13 ENCOUNTER — Encounter: Payer: Self-pay | Admitting: Family

## 2024-05-13 VITALS — BP 114/78 | HR 103 | Temp 99.6°F | Ht 68.0 in | Wt 220.6 lb

## 2024-05-13 DIAGNOSIS — E538 Deficiency of other specified B group vitamins: Secondary | ICD-10-CM

## 2024-05-13 DIAGNOSIS — Z20822 Contact with and (suspected) exposure to covid-19: Secondary | ICD-10-CM | POA: Diagnosis not present

## 2024-05-13 DIAGNOSIS — E782 Mixed hyperlipidemia: Secondary | ICD-10-CM | POA: Diagnosis not present

## 2024-05-13 DIAGNOSIS — L659 Nonscarring hair loss, unspecified: Secondary | ICD-10-CM | POA: Diagnosis not present

## 2024-05-13 DIAGNOSIS — Z0000 Encounter for general adult medical examination without abnormal findings: Secondary | ICD-10-CM | POA: Diagnosis not present

## 2024-05-13 LAB — LIPID PANEL
Cholesterol: 187 mg/dL (ref 0–200)
HDL: 36.6 mg/dL — ABNORMAL LOW (ref >39.00)
LDL Cholesterol: 117 mg/dL — ABNORMAL HIGH (ref 0–99)
NonHDL: 150.52
Total CHOL/HDL Ratio: 5
Triglycerides: 166 mg/dL — ABNORMAL HIGH (ref 0.0–149.0)
VLDL: 33.2 mg/dL (ref 0.0–40.0)

## 2024-05-13 LAB — CBC
HCT: 48.8 % (ref 39.0–52.0)
Hemoglobin: 15.8 g/dL (ref 13.0–17.0)
MCHC: 32.4 g/dL (ref 30.0–36.0)
MCV: 79.5 fl (ref 78.0–100.0)
Platelets: 304 K/uL (ref 150.0–400.0)
RBC: 6.14 Mil/uL — ABNORMAL HIGH (ref 4.22–5.81)
RDW: 13.2 % (ref 11.5–15.5)
WBC: 12.3 K/uL — ABNORMAL HIGH (ref 4.0–10.5)

## 2024-05-13 LAB — BASIC METABOLIC PANEL WITH GFR
BUN: 13 mg/dL (ref 6–23)
CO2: 30 meq/L (ref 19–32)
Calcium: 10.7 mg/dL — ABNORMAL HIGH (ref 8.4–10.5)
Chloride: 101 meq/L (ref 96–112)
Creatinine, Ser: 0.83 mg/dL (ref 0.40–1.50)
GFR: 113.22 mL/min (ref >60.00)
Glucose, Bld: 84 mg/dL (ref 70–99)
Potassium: 4.9 meq/L (ref 3.5–5.1)
Sodium: 141 meq/L (ref 135–145)

## 2024-05-13 LAB — VITAMIN B12: Vitamin B-12: 383 pg/mL (ref 211–911)

## 2024-05-13 LAB — POC COVID19 BINAXNOW: SARS Coronavirus 2 Ag: NEGATIVE

## 2024-05-13 LAB — TSH: TSH: 0.43 u[IU]/mL (ref 0.35–5.50)

## 2024-05-13 NOTE — Progress Notes (Signed)
 Subjective:  Patient ID: James Forbes, male    DOB: Feb 21, 1991  Age: 33 y.o. MRN: [id number]  Patient Care Team: Corwin Antu, FNP as PCP - General (Family Medicine)   CC:  Chief Complaint  Patient presents with   Annual Exam    HPI James Forbes is a 33 y.o. male who presents today for an annual physical exam. He reports consuming a general diet. The patient does not participate in regular exercise at present. He generally feels well. He reports sleeping well. He does not have additional problems to discuss today.   Vision:Within last year Dental:Receives regular dental care  Pt is with out acute concerns.   Discussed the use of AI scribe software for clinical note transcription with the patient, who gave verbal consent to proceed.  History of Present Illness James Forbes is a 33 year old male who presents for an annual physical exam and concerns about hair loss.  He is experiencing hair loss, including eyebrows, and is not using any treatments like Rogaine currently. His father is bald, indicating a family history of hair loss.  He has a persistent cough following a recent illness that affected his family, with his son being sick first. No current fever, sore throat, or congestion reported by the patient.  He experiences intermittent eye redness and pain, but recent eye exams were normal.  He is not currently exercising and reports a disrupted sleep schedule due to his children, waking at 11 PM, 3 AM, and 6 AM. He recently took a second job to support his family, impacting his sleep and daily routine.  He declined the flu shot and HPV vaccine but agreed to receive a tetanus booster, as he recalls cutting himself on metal and it has likely been over five years since his last shot.   Advanced Directives Patient does not have advanced directives     DEPRESSION SCREENING    05/14/2023    8:13 AM 04/10/2021   10:33 AM 02/18/2021    1:56 PM  PHQ 2/9 Scores  PHQ - 2  Score 0 0 0  PHQ- 9 Score 0 6 0     ROS: Negative unless specifically indicated above in HPI.    Current Outpatient Medications:    omeprazole (PRILOSEC) 20 MG capsule, Take 20 mg by mouth daily., Disp: , Rfl:     Objective:    BP 114/78 (BP Location: Left Arm, Patient Position: Sitting, Cuff Size: Large)   Pulse (!) 103   Temp 99.6 F (37.6 C) (Temporal)   Ht 5' 8 (1.727 m)   Wt 220 lb 9.6 oz (100.1 kg)   SpO2 99%   BMI 33.54 kg/m   BP Readings from Last 3 Encounters:  05/13/24 114/78  09/04/23 126/80  08/19/23 110/70      Physical Exam Vitals reviewed.  Constitutional:      General: He is not in acute distress.    Appearance: Normal appearance. He is normal weight. He is not ill-appearing, toxic-appearing or diaphoretic.  HENT:     Head: Normocephalic.     Right Ear: Tympanic membrane normal.     Left Ear: Tympanic membrane normal.     Nose: Nose normal.     Mouth/Throat:     Mouth: Mucous membranes are moist.  Eyes:     Pupils: Pupils are equal, round, and reactive to light.  Cardiovascular:     Rate and Rhythm: Normal rate and regular rhythm.  Pulmonary:     Effort: Pulmonary  effort is normal.     Breath sounds: Normal breath sounds.  Abdominal:     General: Abdomen is flat.     Tenderness: There is no abdominal tenderness.  Musculoskeletal:        General: Normal range of motion.  Skin:    General: Skin is warm.  Neurological:     General: No focal deficit present.     Mental Status: He is alert and oriented to person, place, and time. Mental status is at baseline.  Psychiatric:        Mood and Affect: Mood normal.        Behavior: Behavior normal.        Thought Content: Thought content normal.        Judgment: Judgment normal.    Low grade fever   Results       Assessment & Plan:   Assessment and Plan Assessment & Plan  Assessment and Plan Assessment & Plan    Hair loss and eyebrow thinning Reports hair loss and eyebrow  thinning. Previous thyroid  and iron tests were normal. Differential diagnosis includes male pattern baldness, thyroid  dysfunction, and iron deficiency. Not currently using treatments like Rogaine or finasteride. - Order thyroid  function tests and iron studies  Acute upper respiratory infection with congestion and cough Reports persistent cough and congestion, possibly related to a recent illness in the household. No fever or sore throat. COVID-19 is considered due to current prevalence. - Perform COVID-19 test, negative in office   Acute wellness visit Declined flu vaccine, agreed to tetanus vaccine, not interested in HPV vaccine. Recent eye exam normal, up to date with dental exams. - Administer tetanus vaccine - Perform yearly labs Patient Counseling(The following topics were reviewed):  Preventative care handout given to pt  Health maintenance and immunizations reviewed. Please refer to Health maintenance section. Pt advised on safe sex, wearing seatbelts in car, and proper nutrition labwork ordered today for annual Dental health: Discussed importance of regular tooth brushing, flossing, and dental visits.          Follow-up: Return in about 1 year (around 05/13/2025) for f/u CPE.   Ginger Patrick, FNP

## 2024-05-19 ENCOUNTER — Ambulatory Visit: Payer: Self-pay | Admitting: Family

## 2024-08-17 ENCOUNTER — Encounter: Payer: Self-pay | Admitting: Family

## 2024-08-17 ENCOUNTER — Ambulatory Visit: Admitting: Family

## 2024-08-17 VITALS — BP 118/72 | HR 84 | Temp 98.4°F | Ht 68.0 in | Wt 227.2 lb

## 2024-08-17 DIAGNOSIS — M62838 Other muscle spasm: Secondary | ICD-10-CM | POA: Diagnosis not present

## 2024-08-17 DIAGNOSIS — R1011 Right upper quadrant pain: Secondary | ICD-10-CM

## 2024-08-17 LAB — CBC WITH DIFFERENTIAL/PLATELET
Basophils Absolute: 0.1 K/uL (ref 0.0–0.1)
Basophils Relative: 1.1 % (ref 0.0–3.0)
Eosinophils Absolute: 0.2 K/uL (ref 0.0–0.7)
Eosinophils Relative: 3.8 % (ref 0.0–5.0)
HCT: 49.6 % (ref 39.0–52.0)
Hemoglobin: 16.1 g/dL (ref 13.0–17.0)
Lymphocytes Relative: 20.8 % (ref 12.0–46.0)
Lymphs Abs: 1.3 K/uL (ref 0.7–4.0)
MCHC: 32.4 g/dL (ref 30.0–36.0)
MCV: 79.7 fl (ref 78.0–100.0)
Monocytes Absolute: 0.6 K/uL (ref 0.1–1.0)
Monocytes Relative: 9 % (ref 3.0–12.0)
Neutro Abs: 4.1 K/uL (ref 1.4–7.7)
Neutrophils Relative %: 65.3 % (ref 43.0–77.0)
Platelets: 306 K/uL (ref 150.0–400.0)
RBC: 6.23 Mil/uL — ABNORMAL HIGH (ref 4.22–5.81)
RDW: 13.7 % (ref 11.5–15.5)
WBC: 6.2 K/uL (ref 4.0–10.5)

## 2024-08-17 LAB — COMPREHENSIVE METABOLIC PANEL WITH GFR
ALT: 42 U/L (ref 3–53)
AST: 24 U/L (ref 5–37)
Albumin: 4.6 g/dL (ref 3.5–5.2)
Alkaline Phosphatase: 60 U/L (ref 39–117)
BUN: 15 mg/dL (ref 6–23)
CO2: 30 meq/L (ref 19–32)
Calcium: 9.7 mg/dL (ref 8.4–10.5)
Chloride: 102 meq/L (ref 96–112)
Creatinine, Ser: 0.92 mg/dL (ref 0.40–1.50)
GFR: 107.41 mL/min
Glucose, Bld: 84 mg/dL (ref 70–99)
Potassium: 4.1 meq/L (ref 3.5–5.1)
Sodium: 140 meq/L (ref 135–145)
Total Bilirubin: 0.6 mg/dL (ref 0.2–1.2)
Total Protein: 7.3 g/dL (ref 6.0–8.3)

## 2024-08-17 MED ORDER — TIZANIDINE HCL 4 MG PO TABS: ORAL_TABLET | ORAL | 0 refills | Status: AC

## 2024-08-17 NOTE — Progress Notes (Signed)
 "  Established Patient Office Visit  Subjective:      CC:  Chief Complaint  Patient presents with   Abdominal Pain    HPI: James Forbes is a 34 y.o. male presenting on 08/17/2024 for Abdominal Pain .  Discussed the use of AI scribe software for clinical note transcription with the patient, who gave verbal consent to proceed.  History of Present Illness James Forbes is a 34 year old male who presents with right-sided abdominal pain. He is accompanied by his child, who is currently experiencing a cough.  He has been experiencing right-sided abdominal pain for the past three weeks. The pain initially started in his back and has since radiated to the front, under the right rib. It is described as 'real achy' and is exacerbated by prolonged sitting, particularly after work hours. Stretching sometimes provides slight relief, and he uses a heated blanket on the area, which helps temporarily.  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 He has a walking pad but has not used it due to a busy schedule.  No issues with eating, bowel movements, nausea, vomiting, or heartburn. He experiences some burping but not more than usual. He has not noticed any specific food triggers, although his diet includes whole milk, Greek yogurt, and ramen noodles. He feels bloated and reports his diet includes smoothies with whole milk and Greek yogurt, and ramen noodles for lunch.  He has not tried any medications like ibuprofen or Tylenol for the pain. No changes in urinary habits or pain radiating to the shoulder blades. No specific aggravating factors related to movement or palpation, and the pain does not worsen with arm movement. He has not tried any muscle relaxers or pain medications yet.         Social history:  Relevant past medical, surgical, family and social history reviewed and updated as indicated. Interim  medical history since our last visit reviewed.  Allergies and medications reviewed and updated.  DATA REVIEWED: CHART IN EPIC     ROS: Negative unless specifically indicated above in HPI.   Current Medications[1]        Objective:        BP 118/72 (BP Location: Left Arm, Patient Position: Sitting, Cuff Size: Large)   Pulse 84   Temp 98.4 F (36.9 C) (Temporal)   Ht 5' 8 (1.727 m)   Wt 227 lb 3.2 oz (103.1 kg)   SpO2 97%   BMI 34.55 kg/m   Physical Exam MEASUREMENTS: Weight- 225. ABDOMEN: Abdomen bloated.  Wt Readings from Last 3 Encounters:  08/17/24 227 lb 3.2 oz (103.1 kg)  05/13/24 220 lb 9.6 oz (100.1 kg)  09/04/23 212 lb (96.2 kg)    Physical Exam Vitals reviewed.  Cardiovascular:     Rate and Rhythm: Normal rate and regular rhythm.  Pulmonary:     Effort: Pulmonary effort is normal.  Abdominal:     General: Abdomen is protuberant. Bowel sounds are normal.     Palpations: Abdomen is soft.     Tenderness: There is abdominal tenderness in the right upper quadrant and epigastric area. There is no right CVA tenderness or left CVA tenderness.     Hernia: No hernia is present.  Musculoskeletal:     Thoracic back: No spasms or tenderness.     Lumbar back: No spasms or tenderness.          Results  Assessment & Plan:   Assessment and Plan Assessment & Plan Right upper quadrant abdominal pain Intermittent right upper quadrant abdominal pain for three weeks, exacerbated by prolonged sitting. Pain is achy, located under the right rib, and radiates around the side. No associated nausea, vomiting, or changes in bowel movements. Differential diagnosis includes gallbladder pathology and muscular pain. Bloating noted, possibly related to dietary habits. - Ordered liver function tests and complete blood count. - Ordered ultrasound of the gallbladder. - Advised dietary modifications to reduce intake of fatty and heavy foods. - Prescribed ibuprofen for  pain management. - Prescribed muscle relaxer for nighttime use. -ddx muscle strain, cholecystitis, dietary sensitivities  Muscle spasm Possible muscle spasm contributing to abdominal discomfort, potentially related to prolonged sitting and posture. Pain not reproducible on palpation, which is less typical for a muscular origin, but muscular etiology still considered due to positional nature of symptoms. - Prescribed muscle relaxer for nighttime use. - Advised use of ibuprofen or acetaminophen for pain relief. - Recommended use of a heated blanket for symptomatic relief.        Return if symptoms worsen or fail to improve.     Ginger Patrick, MSN, APRN, FNP-C [facility]        [1]  Current Outpatient Medications:    omeprazole (PRILOSEC) 20 MG capsule, Take 20 mg by mouth daily., Disp: , Rfl:    tiZANidine  (ZANAFLEX ) 4 MG tablet, Take 1/2 to one tablet po at bedtime prn muscle spasm, Disp: 20 tablet, Rfl: 0  "

## 2024-08-17 NOTE — Patient Instructions (Signed)
  I have sent in your order electronically for the following: ultrasound abdomen  at this location below. Please call to schedule the appointment at your convenience  [facility] outpatient imaging center off [address], [place] [address] Phone [phone]-  8-5 pm

## 2024-08-22 ENCOUNTER — Ambulatory Visit: Payer: Self-pay | Admitting: Family

## 2024-08-22 DIAGNOSIS — R1011 Right upper quadrant pain: Secondary | ICD-10-CM

## 2024-08-22 DIAGNOSIS — K824 Cholesterolosis of gallbladder: Secondary | ICD-10-CM

## 2024-08-23 ENCOUNTER — Ambulatory Visit
Admission: RE | Admit: 2024-08-23 | Discharge: 2024-08-23 | Disposition: A | Discharge status: Home / Self Care | Source: Ambulatory Visit | Attending: Family | Admitting: Family

## 2024-08-23 DIAGNOSIS — R1011 Right upper quadrant pain: Secondary | ICD-10-CM | POA: Insufficient documentation

## 2024-08-24 NOTE — Addendum Note (Signed)
 Addended by: CORWIN ANTU on: 08/24/2024 09:48 PM   Modules accepted: Orders

## 2024-08-27 ENCOUNTER — Other Ambulatory Visit: Payer: Self-pay | Admitting: Nurse Practitioner

## 2024-08-27 ENCOUNTER — Telehealth: Admitting: Nurse Practitioner

## 2024-08-27 DIAGNOSIS — R52 Pain, unspecified: Secondary | ICD-10-CM | POA: Diagnosis not present

## 2024-08-27 DIAGNOSIS — J069 Acute upper respiratory infection, unspecified: Secondary | ICD-10-CM | POA: Diagnosis not present

## 2024-08-27 MED ORDER — BENZONATATE 200 MG PO CAPS: 200.0000 mg | ORAL_CAPSULE | Freq: Two times a day (BID) | ORAL | 0 refills | Status: AC | PRN

## 2024-08-27 MED ORDER — OSELTAMIVIR PHOSPHATE 75 MG PO CAPS: 75.0000 mg | ORAL_CAPSULE | Freq: Two times a day (BID) | ORAL | 0 refills | Status: AC

## 2024-08-27 MED ORDER — IBUPROFEN 800 MG PO TABS: 800.0000 mg | ORAL_TABLET | Freq: Three times a day (TID) | ORAL | 0 refills | Status: AC | PRN

## 2024-08-27 MED ORDER — PROMETHAZINE-DM 6.25-15 MG/5ML PO SYRP: 5.0000 mL | ORAL_SOLUTION | Freq: Four times a day (QID) | ORAL | 0 refills | Status: AC | PRN

## 2024-08-27 NOTE — Progress Notes (Signed)
 Thank you for letting me know. I have sent tamiflu 

## 2024-08-27 NOTE — Progress Notes (Signed)
 Hello Mr. Stradford,  I am sorry you are not feeling well.  Unfortunately, we do not treat with tamiflu  for flu like symptoms unless you have a positive test result or someone in your home was diagnosed with the flu by a POSITIVE test. I apologize for the inconvenience but this is something that we are not supposed to treat on presumption as it could also be COVID or a viral infection. If you would like to take an OTC test for COVID/FLU and let us  know the results we will be glad to treat with an antiviral. Until then we will treat for symptoms only at this time.   E visit for Flu like symptoms   We are sorry that you are not feeling well.  Here is how we plan to help! Based on what you have shared with me it looks like you may have a respiratory virus that may be influenza.  Influenza or the flu is  an infection caused by a respiratory virus. The flu virus is highly contagious and persons who did not receive their yearly flu vaccination may catch the flu from close contact. .  At this time I have prescribed Motrin  800 mg and a cough syrup for you symptoms.   For nasal congestion, you may use an oral decongestant such as Mucinex D or if you have glaucoma or high blood pressure use plain Mucinex.  Saline nasal spray or nasal drops can help and can safely be used as often as needed for congestion.  If you have a sore or scratchy throat, use a saltwater gargle-  to  teaspoon of salt dissolved in a 4-ounce to 8-ounce glass of warm water.  Gargle the solution for approximately 15-30 seconds and then spit.  It is important not to swallow the solution.  You can also use throat lozenges/cough drops and Chloraseptic spray to help with throat pain or discomfort.  Warm or cold liquids can also be helpful in relieving throat pain.  For headache, pain or general discomfort, you can use Ibuprofen  or Tylenol as directed.   Some authorities believe that zinc sprays or the use of Echinacea may shorten the  course of your symptoms.   You are to isolate at home until you have been fever-free for at least 24 hours without a fever-reducing medication, and symptoms have been steadily improving for 24 hours.  If you must be around other household members who do not have symptoms, you need to make sure that both you and the family members are masking consistently with a high-quality mask.  If you note any worsening of symptoms despite treatment, please seek an in-person evaluation ASAP. If you note any significant shortness of breath or any chest pain, please seek ED evaluation. Please do not delay care!  ANYONE WHO HAS FLU SYMPTOMS SHOULD: Stay home. The flu is highly contagious and going out or to work exposes others! Be sure to drink plenty of fluids. Water is fine as well as fruit juices, sodas and electrolyte beverages. You may want to stay away from caffeine or alcohol. If you are nauseated, try taking small sips of liquids. How do you know if you are getting enough fluid? Your urine should be a pale yellow or almost colorless. Get rest. Taking a steamy shower or using a humidifier may help nasal congestion and ease sore throat pain. Using a saline nasal spray works much the same way. Cough drops, hard candies and sore throat lozenges may ease your cough. Line  up a caregiver. Have someone check on you regularly.  GET HELP RIGHT AWAY IF: You cannot keep down liquids or your medications. You become short of breath Your fell like you are going to pass out or loose consciousness. Your symptoms persist after you have completed your treatment plan  MAKE SURE YOU  Understand these instructions. Will watch your condition. Will get help right away if you are not doing well or get worse.  Your e-visit answers were reviewed by a board certified advanced clinical practitioner to complete your personal care plan.  Depending on the condition, your plan could have included both over the counter or  prescription medications.  If there is a problem please reply  once you have received a response from your provider.  Your safety is important to us .  If you have drug allergies check your prescription carefully.    You can use MyChart to ask questions about todays visit, request a non-urgent call back, or ask for a work or school excuse for 24 hours related to this e-Visit. If it has been greater than 24 hours you will need to follow up with your provider, or enter a new e-Visit to address those concerns.  You will get an e-mail in the next two days asking about your experience.  I hope that your e-visit has been valuable and will speed your recovery. Thank you for using e-visits.   I have spent 5 minutes in review of e-visit questionnaire, review and updating patient chart, medical decision making and response to patient.   Dondi Aime W Zaiah Eckerson, NP

## 2024-08-31 ENCOUNTER — Ambulatory Visit: Payer: Self-pay | Admitting: Surgery

## 2024-08-31 ENCOUNTER — Encounter: Payer: Self-pay | Admitting: Surgery

## 2024-08-31 VITALS — BP 120/82 | HR 96 | Temp 98.4°F | Ht 67.0 in | Wt 223.6 lb

## 2024-08-31 DIAGNOSIS — R1011 Right upper quadrant pain: Secondary | ICD-10-CM

## 2024-08-31 DIAGNOSIS — M549 Dorsalgia, unspecified: Secondary | ICD-10-CM | POA: Diagnosis not present

## 2024-08-31 NOTE — Patient Instructions (Signed)
 We have scheduled you for a CT Scan of your Abdomen and Pelvis with contrast. This has been scheduled at Outpatient [facility] on [address] on 09/06/2024 . Please arrive there by 12:45 pm . If you need to reschedule your Scan, you may do so by calling [phone]. Please let us  know if you reschedule your scan as we have to get authorization from your insurance for this.     Gallbladder Problems: Eating Plan  Having high blood cholesterol, obesity, an inactive lifestyle, an unhealthy diet, or diabetes can put you at risk for getting gallstones. If you have a gallbladder problem, you may have trouble digesting fats. You may also have symptoms when you eat a lot of fat. Eating a low-fat diet can help lessen your symptoms. It may be helpful before and after having surgery to have your gallbladder taken out. Your health care provider may recommend that you work with an expert in healthy eating called a dietitian. They can help you lower the amount of fat in your diet. What are tips for following this plan? General guidelines Limit how much fat you have to less than 30% of your total daily calories. If you eat around 1,800 calories each day, you'll be eating less than 60 grams (g) of fat a day. Fat is an important part of a healthy diet. Eating a low-fat diet can make it hard to keep a healthy body weight. Ask your dietitian how much fat, calories, and other nutrients you need each day. Eat small meals often throughout the day instead of 3 large meals. Drink at least 8-10 cups (1.9-2.4 L) of fluid a day. If you drink alcohol: Limit how much you have to: 0-1 drink a day if you're male. 0-2 drinks a day if you're male. Know how much alcohol is in your drink. In the [place] one drink is one 12 oz bottle of beer (355 mL), one 5 oz glass of wine (148 mL), or one 1 oz glass of hard liquor (44 mL). Reading food labels  Check nutrition facts on food labels for how much fat is in a serving. Choose  foods with less than 3 grams of fat per serving. Shopping Choose nonfat and low-fat healthy foods. Look for the words nonfat, low-fat, or fat-free. Avoid buying processed or prepackaged foods. Cooking Cook using low-fat methods, such as baking, broiling, grilling, or boiling. Cook with small amounts of healthy fats, such as olive oil, grapeseed oil, canola oil, avocado oil, or sunflower oil. What foods are recommended?  All fresh, frozen, or canned fruits and vegetables. Whole grains. Low-fat or nonfat (skim) milk and yogurt. Lean meat, skinless poultry, fish, eggs, and beans. Low-fat protein supplement powders or drinks. Spices and herbs. The items listed above may not be all the foods and drinks you can have. Talk with a dietitian to learn more. What foods are not recommended? High-fat foods. These include baked goods, fast food, fatty cuts of meat, ice cream, french toast, sweet rolls, pizza, cheese bread, foods covered with butter, creamy sauces, or cheese. Fried foods. These include french fries, tempura, battered fish, breaded chicken, fried breads, and sweets. Foods that cause bloating and gas. The items listed above may not be all the foods and drinks you should avoid. Talk with a dietitian to learn more. This information is not intended to replace advice given to you by your health care provider. Make sure you discuss any questions you have with your health care provider. Document Revised: 11/03/2023 Document  Reviewed: 11/03/2023 Elsevier Patient Education  [employer].

## 2024-09-02 ENCOUNTER — Encounter: Payer: Self-pay | Admitting: Surgery

## 2024-09-02 NOTE — Progress Notes (Signed)
 Patient ID: James Forbes, male   DOB: 11-Dec-1990, 34 y.o.   MRN: [id number]  HPI James Forbes is a 34 y.o. male seen in consultation at the request of Ms. Tedra Peabody FNP.  He comes in with intermittent back pain and abdominal pain.  Seems to be intermittent moderate intensity and dull.  He seems to think that the pain is as exacerbated with certain meals but is not clear-cut.  He did have an ultrasound that I personally reviewed showing evidence of subcentimeter's gallbladder polyps without evidence of stones normal common bile duct.  CBC and CMP is completely normal. Is able to perform more than 4 METS of activity without shortness of breath or chest pain.  He does have some reflux response to over-the-counter meds.  No prior abdominal operations. HPI  Past Medical History:  Diagnosis Date   GERD (gastroesophageal reflux disease)     Past Surgical History:  Procedure Laterality Date   NO PAST SURGERIES      Family History  Problem Relation Age of Onset   Alcoholism Mother    Dementia Father        early onset   Alcoholism Father    Stomach cancer Maternal Grandmother    Healthy Paternal Grandmother    Dementia Paternal Grandfather     Social History Social History[1]  Allergies[2]  Current Outpatient Medications  Medication Sig Dispense Refill   benzonatate  (TESSALON ) 200 MG capsule Take 1 capsule (200 mg total) by mouth 2 (two) times daily as needed for cough. 20 capsule 0   ibuprofen  (ADVIL ) 800 MG tablet Take 1 tablet (800 mg total) by mouth every 8 (eight) hours as needed. 30 tablet 0   omeprazole (PRILOSEC) 20 MG capsule Take 20 mg by mouth daily.     promethazine -dextromethorphan (PROMETHAZINE -DM) 6.25-15 MG/5ML syrup Take 5 mLs by mouth 4 (four) times daily as needed. 240 mL 0   tiZANidine  (ZANAFLEX ) 4 MG tablet Take 1/2 to one tablet po at bedtime prn muscle spasm 20 tablet 0   No current facility-administered medications for this visit.     Review of  Systems Full ROS  was asked and was negative except for the information on the HPI  Physical Exam Blood pressure 120/82, pulse 96, temperature 98.4 F (36.9 C), temperature source Oral, height 5' 7 (1.702 m), weight 223 lb 9.6 oz (101.4 kg), SpO2 97%. CONSTITUTIONAL: NAD.BMI 35 EYES: Pupils are equal, round, Sclera are non-icteric. EARS, NOSE, MOUTH AND THROAT: The oropharynx is clear. The oral mucosa is pink and moist. Hearing is intact to voice. LYMPH NODES:  Lymph nodes in the neck are normal. RESPIRATORY:  Lungs are clear. There is normal respiratory effort, with equal breath sounds bilaterally, and without pathologic use of accessory muscles. CARDIOVASCULAR: Heart is regular without murmurs, gallops, or rubs. GI: The abdomen is  soft, mild tenderness palpation epigastric area and right upper quadrant without Murphy sign no rebound there are no palpable masses. There is no hepatosplenomegaly. There are normal bowel sounds in all quadrants. GU: Rectal deferred.   MUSCULOSKELETAL: Normal muscle strength and tone. No cyanosis or edema.   SKIN: Turgor is good and there are no pathologic skin lesions or ulcers. NEUROLOGIC: Motor and sensation is grossly normal. Cranial nerves are grossly intact. PSYCH:  Oriented to person, place and time. Affect is normal.  Data Reviewed I have personally reviewed the patient's imaging, laboratory findings and medical records.    Assessment/Plan Epigastric pain and back pain along with gallstones.  Symptoms  are not classic for biliary colic.  Discussed with him in detail about his disease process.  Gallbladder polyps usually will be need to be removed by cholecystectomy if they are greater than 1 cm.  We are not there yet.  I would like to interrogate the etiology of his abdominal and back pain.  Discussed with him in detail about options.  Potentially we could rule out other intra-abdominal pathology.  He wishes to go that route.  Will start with a CT scan of  the abdomen and pelvis and act close follow-up.  He understands that there is a chance that we may have to do cholecystectomy for both diagnostic and therapeutic purposes.  Currently he is not toxic and does not require hospitalization.  A copy of this report was sent to the referring provider. I personally spent a total of 55 minutes in the care of the patient today including performing a medically appropriate exam/evaluation, counseling and educating, placing orders, referring and communicating with other health care professionals, documenting clinical information in the EHR, independently interpreting and reviewing images studies and coordinating care.    Laneta Luna, MD FACS General Surgeon 09/02/2024, 10:41 AM      [1]  Social History Tobacco Use   Smoking status: Never   Smokeless tobacco: Never  Vaping Use   Vaping status: Never Used  Substance Use Topics   Alcohol use: Not Currently   Drug use: Not Currently    Frequency: 7.0 times per week    Types: Marijuana  [2] No Known Allergies

## 2024-09-06 ENCOUNTER — Ambulatory Visit: Admission: RE | Admit: 2024-09-06 | Source: Ambulatory Visit

## 2024-09-09 ENCOUNTER — Ambulatory Visit: Admission: RE | Admit: 2024-09-09

## 2024-09-28 ENCOUNTER — Ambulatory Visit: Admitting: Surgery
